# Patient Record
Sex: Female | Born: 1980 | Race: Black or African American | Hispanic: No | Marital: Married | Smoking: Never smoker
Health system: Southern US, Community
[De-identification: ages and names within clinical notes are randomized; demographics above are authoritative.]

## PROBLEM LIST (undated history)

## (undated) DIAGNOSIS — T7840XA Allergy, unspecified, initial encounter: Secondary | ICD-10-CM

## (undated) DIAGNOSIS — T783XXA Angioneurotic edema, initial encounter: Secondary | ICD-10-CM

## (undated) DIAGNOSIS — L509 Urticaria, unspecified: Secondary | ICD-10-CM

## (undated) HISTORY — DX: Allergy, unspecified, initial encounter: T78.40XA

## (undated) HISTORY — PX: APPENDECTOMY: SHX54

## (undated) HISTORY — DX: Angioneurotic edema, initial encounter: T78.3XXA

## (undated) HISTORY — DX: Urticaria, unspecified: L50.9

---
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
## 2002-01-19 ENCOUNTER — Encounter: Admission: RE | Admit: 2002-01-19 | Discharge: 2002-01-19 | Payer: Self-pay

## 2007-03-16 ENCOUNTER — Ambulatory Visit (HOSPITAL_COMMUNITY): Admission: RE | Admit: 2007-03-16 | Discharge: 2007-03-16 | Payer: Self-pay | Admitting: Obstetrics and Gynecology

## 2007-07-21 ENCOUNTER — Emergency Department (HOSPITAL_COMMUNITY): Admission: EM | Admit: 2007-07-21 | Discharge: 2007-07-21 | Payer: Self-pay | Admitting: Emergency Medicine

## 2011-06-04 LAB — CBC
Hemoglobin: 14.6
MCHC: 34
RDW: 12.8

## 2011-06-04 LAB — DIFFERENTIAL
Basophils Relative: 1
Eosinophils Absolute: 0.2
Eosinophils Relative: 3
Lymphs Abs: 1.4
Monocytes Absolute: 0.6
Monocytes Relative: 11

## 2011-06-04 LAB — COMPREHENSIVE METABOLIC PANEL
ALT: 14
AST: 36
Albumin: 4
Alkaline Phosphatase: 39
Calcium: 9.3
GFR calc Af Amer: >60
Potassium: 4.7
Sodium: 134 — ABNORMAL LOW
Total Protein: 7.2

## 2011-06-04 LAB — POCT URINALYSIS DIP (DEVICE)
Bilirubin Urine: NEGATIVE
Glucose, UA: NEGATIVE
Nitrite: NEGATIVE
Urobilinogen, UA: 0.2
pH: 6

## 2011-06-04 LAB — POCT PREGNANCY, URINE
Operator id: 116391
Preg Test, Ur: NEGATIVE

## 2011-09-02 ENCOUNTER — Other Ambulatory Visit (HOSPITAL_COMMUNITY)
Admission: RE | Admit: 2011-09-02 | Discharge: 2011-09-02 | Disposition: A | Discharge status: Home / Self Care | Payer: BC Managed Care – PPO | Source: Ambulatory Visit | Attending: Family Medicine | Admitting: Family Medicine

## 2011-09-02 DIAGNOSIS — Z0000 Encounter for general adult medical examination without abnormal findings: Secondary | ICD-10-CM | POA: Insufficient documentation

## 2011-09-02 DIAGNOSIS — Z113 Encounter for screening for infections with a predominantly sexual mode of transmission: Secondary | ICD-10-CM | POA: Insufficient documentation

## 2011-09-02 DIAGNOSIS — R87810 Cervical high risk human papillomavirus (HPV) DNA test positive: Secondary | ICD-10-CM | POA: Insufficient documentation

## 2013-09-17 ENCOUNTER — Other Ambulatory Visit: Payer: Self-pay | Admitting: Family Medicine

## 2013-09-17 ENCOUNTER — Other Ambulatory Visit (HOSPITAL_COMMUNITY)
Admission: RE | Admit: 2013-09-17 | Discharge: 2013-09-17 | Disposition: A | Discharge status: Home / Self Care | Payer: BC Managed Care – PPO | Source: Ambulatory Visit | Attending: Family Medicine | Admitting: Family Medicine

## 2013-09-17 DIAGNOSIS — Z01419 Encounter for gynecological examination (general) (routine) without abnormal findings: Secondary | ICD-10-CM | POA: Insufficient documentation

## 2013-09-17 DIAGNOSIS — Z113 Encounter for screening for infections with a predominantly sexual mode of transmission: Secondary | ICD-10-CM | POA: Insufficient documentation

## 2014-11-16 ENCOUNTER — Other Ambulatory Visit (HOSPITAL_COMMUNITY)
Admission: RE | Admit: 2014-11-16 | Discharge: 2014-11-16 | Disposition: A | Discharge status: Home / Self Care | Payer: BC Managed Care – PPO | Source: Ambulatory Visit | Attending: Family Medicine | Admitting: Family Medicine

## 2014-11-16 ENCOUNTER — Other Ambulatory Visit: Payer: Self-pay | Admitting: Family Medicine

## 2014-11-16 DIAGNOSIS — Z113 Encounter for screening for infections with a predominantly sexual mode of transmission: Secondary | ICD-10-CM | POA: Diagnosis present

## 2014-11-16 DIAGNOSIS — Z1151 Encounter for screening for human papillomavirus (HPV): Secondary | ICD-10-CM | POA: Insufficient documentation

## 2014-11-16 DIAGNOSIS — R87810 Cervical high risk human papillomavirus (HPV) DNA test positive: Secondary | ICD-10-CM | POA: Diagnosis present

## 2014-11-16 DIAGNOSIS — Z01411 Encounter for gynecological examination (general) (routine) with abnormal findings: Secondary | ICD-10-CM | POA: Diagnosis present

## 2014-11-17 LAB — CYTOLOGY - PAP

## 2014-11-24 ENCOUNTER — Telehealth: Payer: Self-pay | Admitting: Hematology

## 2014-11-24 NOTE — Telephone Encounter (Signed)
Called and scheduled patient to see Dr. Mosetta PuttFeng. TG  Feng 01/02/15 10:30 am  Dx: WBC continuing to drop Referring: Dr. Wynelle LinkSun

## 2015-01-02 ENCOUNTER — Encounter: Payer: Self-pay | Admitting: Hematology

## 2015-01-02 ENCOUNTER — Ambulatory Visit (HOSPITAL_BASED_OUTPATIENT_CLINIC_OR_DEPARTMENT_OTHER): Payer: BC Managed Care – PPO | Admitting: Hematology

## 2015-01-02 ENCOUNTER — Telehealth: Payer: Self-pay | Admitting: Hematology

## 2015-01-02 ENCOUNTER — Encounter (INDEPENDENT_AMBULATORY_CARE_PROVIDER_SITE_OTHER): Payer: Self-pay

## 2015-01-02 ENCOUNTER — Ambulatory Visit: Payer: BC Managed Care – PPO

## 2015-01-02 ENCOUNTER — Ambulatory Visit (HOSPITAL_BASED_OUTPATIENT_CLINIC_OR_DEPARTMENT_OTHER): Payer: BC Managed Care – PPO

## 2015-01-02 VITALS — BP 127/62 | HR 73 | Temp 98.4°F | Resp 20 | Ht 66.0 in | Wt 188.1 lb

## 2015-01-02 DIAGNOSIS — D709 Neutropenia, unspecified: Secondary | ICD-10-CM | POA: Diagnosis not present

## 2015-01-02 LAB — COMPREHENSIVE METABOLIC PANEL (CC13)
ALK PHOS: 46 U/L (ref 40–150)
ALT: 14 U/L (ref 0–55)
AST: 14 U/L (ref 5–34)
Albumin: 4 g/dL (ref 3.5–5.0)
Anion Gap: 11 mEq/L (ref 3–11)
BILIRUBIN TOTAL: 0.32 mg/dL (ref 0.20–1.20)
BUN: 11.9 mg/dL (ref 7.0–26.0)
CO2: 23 meq/L (ref 22–29)
Calcium: 8.9 mg/dL (ref 8.4–10.4)
Chloride: 106 mEq/L (ref 98–109)
Creatinine: 0.8 mg/dL (ref 0.6–1.1)
GLUCOSE: 83 mg/dL (ref 70–140)
Potassium: 4.2 mEq/L (ref 3.5–5.1)
Sodium: 140 mEq/L (ref 136–145)
TOTAL PROTEIN: 7.4 g/dL (ref 6.4–8.3)

## 2015-01-02 LAB — CBC & DIFF AND RETIC
BASO%: 1.1 % (ref 0.0–2.0)
Basophils Absolute: 0 10*3/uL (ref 0.0–0.1)
EOS ABS: 0.2 10*3/uL (ref 0.0–0.5)
EOS%: 4 % (ref 0.0–7.0)
HCT: 42.5 % (ref 34.8–46.6)
HGB: 14.5 g/dL (ref 11.6–15.9)
Immature Retic Fract: 1.4 % — ABNORMAL LOW (ref 1.60–10.00)
LYMPH%: 42.9 % (ref 14.0–49.7)
MCH: 30.5 pg (ref 25.1–34.0)
MCHC: 34.1 g/dL (ref 31.5–36.0)
MCV: 89.3 fL (ref 79.5–101.0)
MONO#: 0.4 10*3/uL (ref 0.1–0.9)
MONO%: 11.8 % (ref 0.0–14.0)
NEUT#: 1.5 10*3/uL (ref 1.5–6.5)
NEUT%: 40.2 % (ref 38.4–76.8)
PLATELETS: 209 10*3/uL (ref 145–400)
RBC: 4.76 10*6/uL (ref 3.70–5.45)
RDW: 13.4 % (ref 11.2–14.5)
RETIC %: 2.04 % (ref 0.70–2.10)
Retic Ct Abs: 97.1 10*3/uL — ABNORMAL HIGH (ref 33.70–90.70)
WBC: 3.7 10*3/uL — ABNORMAL LOW (ref 3.9–10.3)
lymph#: 1.6 10*3/uL (ref 0.9–3.3)

## 2015-01-02 LAB — RHEUMATOID FACTOR: Rhuematoid fact SerPl-aCnc: <10 IU/mL (ref <=14)

## 2015-01-02 LAB — LACTATE DEHYDROGENASE (CC13): LDH: 189 U/L (ref 125–245)

## 2015-01-02 NOTE — Telephone Encounter (Signed)
per pof to sch pt appt-gave pt copy of sch-pt back to lab- °

## 2015-01-02 NOTE — Progress Notes (Signed)
Checked in new pt with no financial concerns at this time.  Pt has my card for any billing questions or concerns. °

## 2015-01-02 NOTE — Progress Notes (Signed)
[place]  Telephone:([phone]) [phone] Fax:([phone]) [place] Note   Patient Care Team: Carol Ada, MD as PCP - General (Family Medicine) 01/04/2015  CHIEF COMPLAINTS/PURPOSE OF CONSULTATION:  Neutropenia   Referring physician: Reginia Naas, MD   HISTORY OF PRESENTING ILLNESS:     Holly Reese 34 y.o. female is here because of neutropenia.  According to the medical records from her primary care physician, she has had mild neutropenia since earlier 2014. Her CBC from generally 20th 2014, [place] 23rd 2015, 11/16/2014 showed WBC 3.6K, 2.5K, 2.8K, with ANC 1.2K, 1.1K, and 0.9K. The rest of WBC were normal. Hemoglobin and platelet count are all normal also.  She had several UTI in the past, on average once a year. She was never admitted to hospital or had emergency room visits for infections. She feels well overall, denies any pain, discomfort or other symptoms.    She states she had anemia when she was a child, no treatment needed. It resolved after she grew up.    MEDICAL HISTORY:  Past Medical History  Diagnosis Date  . Allergy     SURGICAL HISTORY: Past Surgical History  Procedure Laterality Date  . Appendectomy      SOCIAL HISTORY: History   Social History  . Marital Status: Single    Spouse Name: N/A  . Number of Children: N/A  . Years of Education: N/A   Occupational History  . Not on file.   Social History Main Topics  . Smoking status: Never Smoker   . Smokeless tobacco: Not on file  . Alcohol Use: 1.8 oz/week    2 Glasses of wine, 1 Cans of beer per week     Comment: twice a week   . Drug Use: No  . Sexual Activity: Not on file   Other Topics Concern  . Not on file   Social History Narrative   She is a high Education officer, museum, no children.    FAMILY HISTORY: Family History  Problem Relation Age of Onset  . Cancer Maternal Aunt     bleeding disorder   . Cancer Maternal Grandfather     throat cancer       ALLERGIES:  has No Known Allergies.  MEDICATIONS:  Current Outpatient Prescriptions  Medication Sig Dispense Refill  . ALTAVERA 0.15-30 MG-MCG tablet Take 1 tablet by mouth daily.  1  . fluticasone (FLONASE) 50 MCG/ACT nasal spray daily as needed.  3  . ibuprofen (ADVIL,MOTRIN) 200 MG tablet Take 200 mg by mouth as needed.    . loratadine (CLARITIN) 10 MG tablet Take 10 mg by mouth daily.     No current facility-administered medications for this visit.    REVIEW OF SYSTEMS:   Constitutional: Denies fevers, chills or abnormal night sweats Eyes: Denies blurriness of vision, double vision or watery eyes Ears, nose, mouth, throat, and face: Denies mucositis or sore throat Respiratory: Denies cough, dyspnea or wheezes Cardiovascular: Denies palpitation, chest discomfort or lower extremity swelling Gastrointestinal:  Denies nausea, heartburn or change in bowel habits Skin: Denies abnormal skin rashes Lymphatics: Denies new lymphadenopathy or easy bruising Neurological:Denies numbness, tingling or new weaknesses Behavioral/Psych: Mood is stable, no new changes  All other systems were reviewed with the patient and are negative.  PHYSICAL EXAMINATION: ECOG PERFORMANCE STATUS: 0 - Asymptomatic  Filed Vitals:   01/02/15 1105  BP: 127/62  Pulse: 73  Temp: 98.4 F (36.9 C)  Resp: 20   Filed Weights   01/02/15  1101 01/02/15 1105  Weight: 188 lb 1.6 oz (85.322 kg) 188 lb 1.6 oz (85.322 kg)    GENERAL:alert, no distress and comfortable SKIN: skin color, texture, turgor are normal, no rashes or significant lesions EYES: normal, conjunctiva are pink and non-injected, sclera clear OROPHARYNX:no exudate, no erythema and lips, buccal mucosa, and tongue normal  NECK: supple, thyroid normal size, non-tender, without nodularity LYMPH:  no palpable lymphadenopathy in the cervical, axillary or inguinal LUNGS: clear to auscultation and percussion with normal breathing effort HEART:  regular rate & rhythm and no murmurs and no lower extremity edema ABDOMEN:abdomen soft, non-tender and normal bowel sounds Musculoskeletal:no cyanosis of digits and no clubbing  PSYCH: alert & oriented x 3 with fluent speech NEURO: no focal motor/sensory deficits  LABORATORY DATA:  I have reviewed the data as listed Lab Results  Component Value Date   WBC 3.7* 01/02/2015   HGB 14.5 01/02/2015   HCT 42.5 01/02/2015   MCV 89.3 01/02/2015   PLT 209 01/02/2015    Recent Labs  01/02/15 1240  NA 140  K 4.2  CO2 23  GLUCOSE 83  BUN 11.9  CREATININE 0.8  CALCIUM 8.9  PROT 7.4  ALBUMIN 4.0  AST 14  ALT 14  ALKPHOS 46  BILITOT 0.32    RADIOGRAPHIC STUDIES: I have personally reviewed the radiological images as listed and agreed with the findings in the report. No results found.  ASSESSMENT & PLAN:  34 year old African-American female, no significant past medical history, presented with chronic mild leukopenia, with predominant neutropenia.  1. Mild neutropenia -Her repeated CBC today showed WBC 3.7, ANC 1.5. The rest of WBC were normal. -She has not had severe infection in the past -Giving the mild neutropenia, and chronic course, this is possibly related to her ethnicity (African-American), benign cyclic neutropenia, or autoimmune mediated neutropenia. -I'll check ANA, ANCA,  rheumatoid arthritis, ESR, hepatitis B and C. She was tested negative for HIV. -I'll also check folic acid and T01 level -I'll obtain ultrasound of abdomen to eval at her liver and spleen -We discussed the risk of infection with neutropenia. She knows to call me or go to the emergency room if she develops fever or not feeling well. -I'll see her back in 3 months with repeated CBC and differential.  All questions were answered. The patient knows to call the clinic with any problems, questions or concerns. I spent 30 minutes counseling the patient face to face. The total time spent in the appointment was  40 minutes and more than 50% was on counseling.     Truitt Merle, MD 01/02/2015 7:08 PM

## 2015-01-03 ENCOUNTER — Encounter: Payer: Self-pay | Admitting: Hematology

## 2015-01-03 DIAGNOSIS — D709 Neutropenia, unspecified: Secondary | ICD-10-CM | POA: Insufficient documentation

## 2015-01-03 LAB — VITAMIN B12: Vitamin B-12: 339 pg/mL (ref 211–911)

## 2015-01-03 LAB — ANCA SCREEN W REFLEX TITER
Atypical p-ANCA Screen: NEGATIVE
C-ANCA SCREEN: POSITIVE — AB
p-ANCA Screen: NEGATIVE

## 2015-01-03 LAB — ANCA TITERS: C-ANCA: 1:40 {titer} — ABNORMAL HIGH

## 2015-01-03 LAB — FOLATE: FOLATE: 13.6 ng/mL

## 2015-01-03 LAB — HEPATITIS C ANTIBODY: HCV Ab: NEGATIVE

## 2015-01-03 LAB — HEPATITIS B SURFACE ANTIGEN: HEP B S AG: NEGATIVE

## 2015-01-03 LAB — HEPATITIS B SURFACE ANTIBODY,QUALITATIVE: HEP B S AB: POSITIVE — AB

## 2015-01-03 LAB — ANA: ANA: NEGATIVE

## 2015-01-04 ENCOUNTER — Encounter: Payer: Self-pay | Admitting: Hematology

## 2015-01-16 ENCOUNTER — Ambulatory Visit (HOSPITAL_COMMUNITY)
Admission: RE | Admit: 2015-01-16 | Discharge: 2015-01-16 | Disposition: A | Discharge status: Home / Self Care | Payer: BC Managed Care – PPO | Source: Ambulatory Visit | Attending: Hematology | Admitting: Hematology

## 2015-01-16 DIAGNOSIS — D709 Neutropenia, unspecified: Secondary | ICD-10-CM | POA: Insufficient documentation

## 2015-02-22 ENCOUNTER — Other Ambulatory Visit: Payer: Self-pay | Admitting: Obstetrics and Gynecology

## 2015-03-23 ENCOUNTER — Telehealth: Payer: Self-pay | Admitting: Hematology

## 2015-03-23 NOTE — Telephone Encounter (Signed)
pt cld to CX appt-stated will call to r/s at a later date

## 2015-04-04 ENCOUNTER — Other Ambulatory Visit: Payer: BC Managed Care – PPO

## 2015-04-04 ENCOUNTER — Ambulatory Visit: Payer: BC Managed Care – PPO | Admitting: Hematology

## 2015-11-17 ENCOUNTER — Other Ambulatory Visit: Payer: Self-pay | Admitting: Family Medicine

## 2015-11-17 ENCOUNTER — Other Ambulatory Visit (HOSPITAL_COMMUNITY)
Admission: RE | Admit: 2015-11-17 | Discharge: 2015-11-17 | Disposition: A | Discharge status: Home / Self Care | Payer: BC Managed Care – PPO | Source: Ambulatory Visit | Attending: Family Medicine | Admitting: Family Medicine

## 2015-11-17 DIAGNOSIS — Z1151 Encounter for screening for human papillomavirus (HPV): Secondary | ICD-10-CM | POA: Insufficient documentation

## 2015-11-17 DIAGNOSIS — Z01411 Encounter for gynecological examination (general) (routine) with abnormal findings: Secondary | ICD-10-CM | POA: Diagnosis present

## 2015-11-17 DIAGNOSIS — Z113 Encounter for screening for infections with a predominantly sexual mode of transmission: Secondary | ICD-10-CM | POA: Diagnosis present

## 2015-11-21 LAB — CYTOLOGY - PAP

## 2016-01-19 IMAGING — US US ABDOMEN COMPLETE
1 series · 13 of 25 positions shown · non-contrast
Comparison: CT scan of the abdomen and pelvis of July 21, 2007

CLINICAL DATA: Unexplained neutropenia

EXAM:
ULTRASOUND ABDOMEN COMPLETE

[Series 1: us abdomen complete · 0.21mm/px · 13 of 97 slices shown]
[im 1/97]
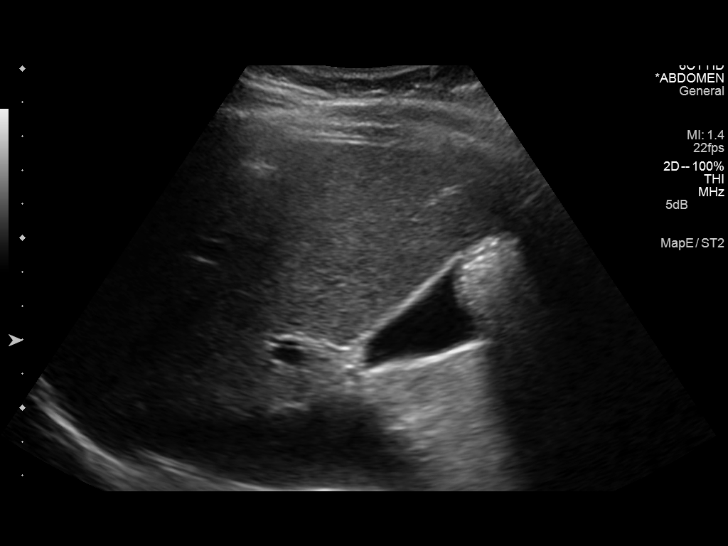
[im 9/97]
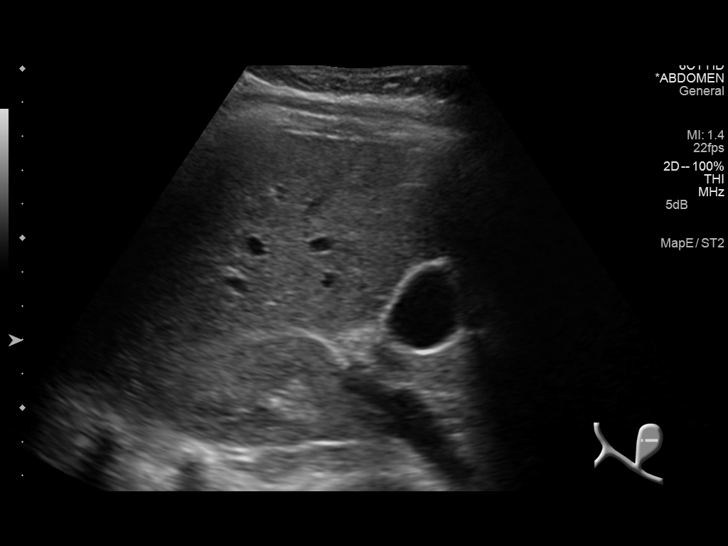
[im 17/97]
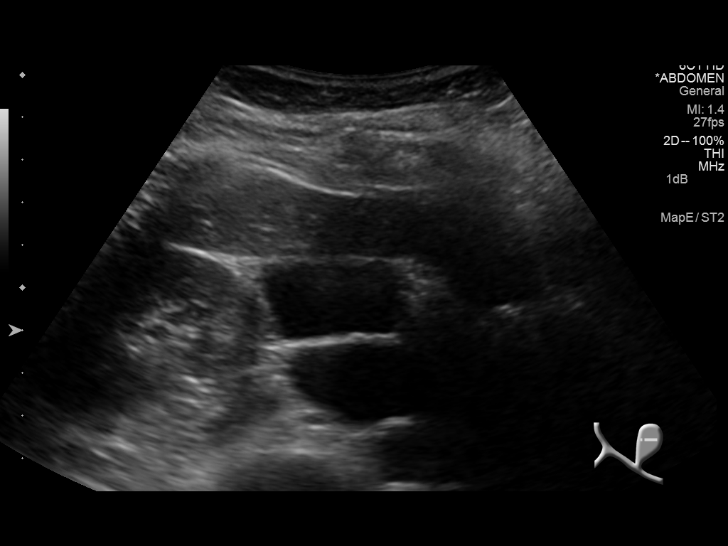
[im 25/97]
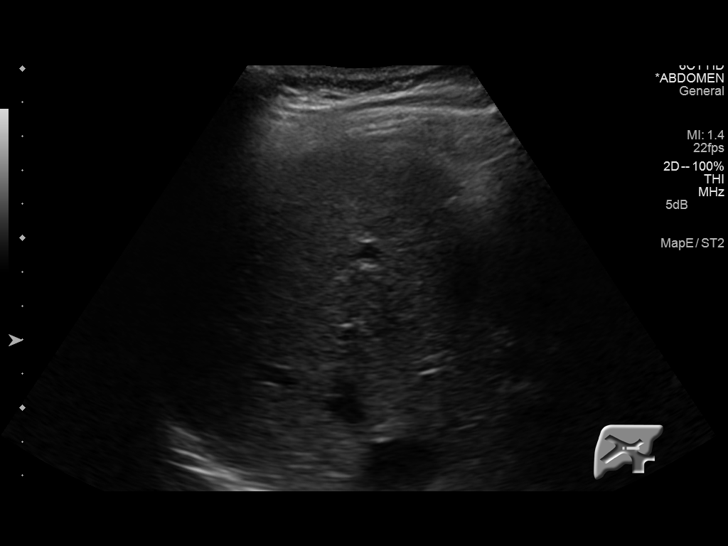
[im 33/97]
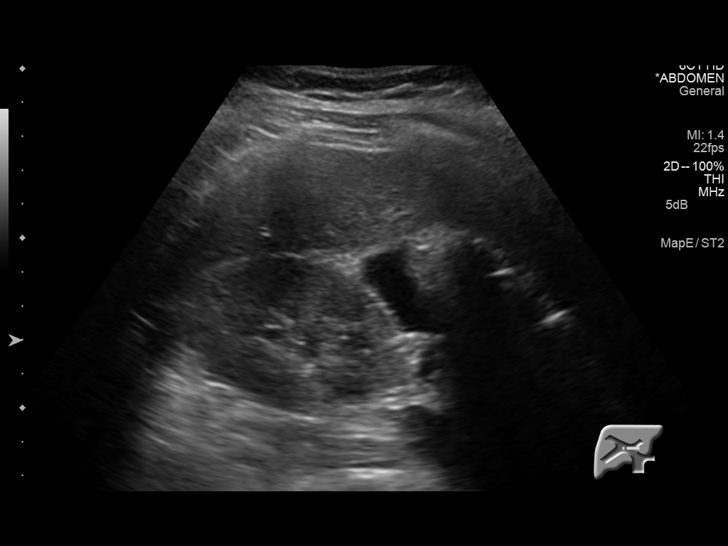
[im 41/97]
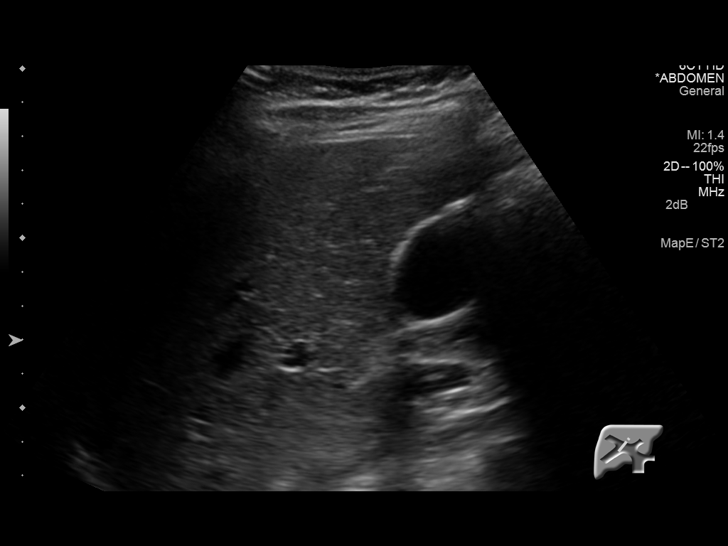
[im 49/97]
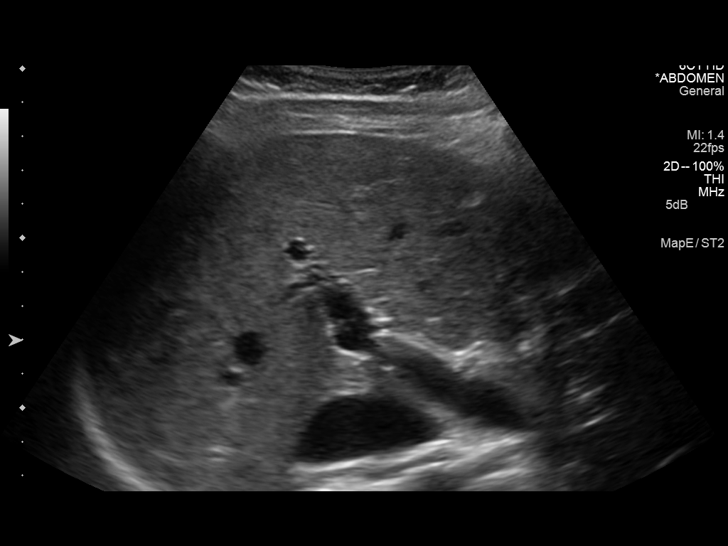
[im 57/97]
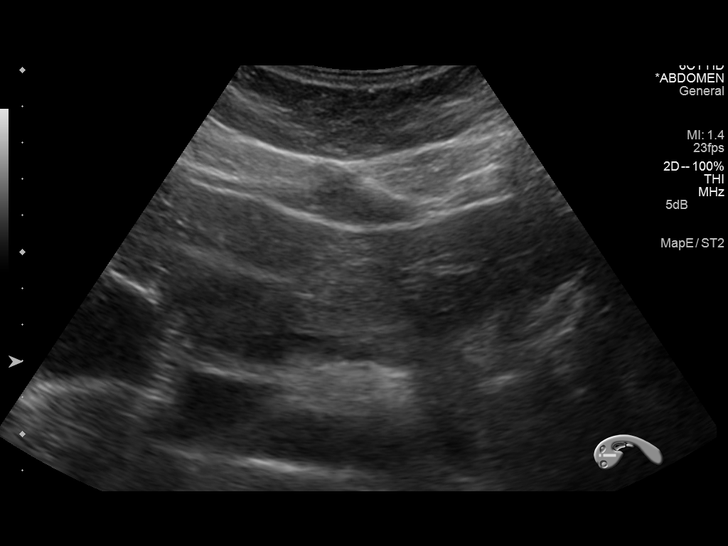
[im 65/97]
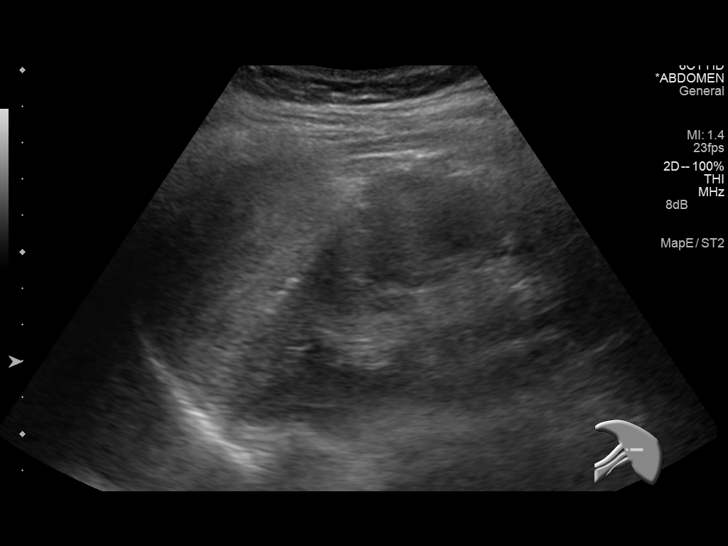
[im 73/97]
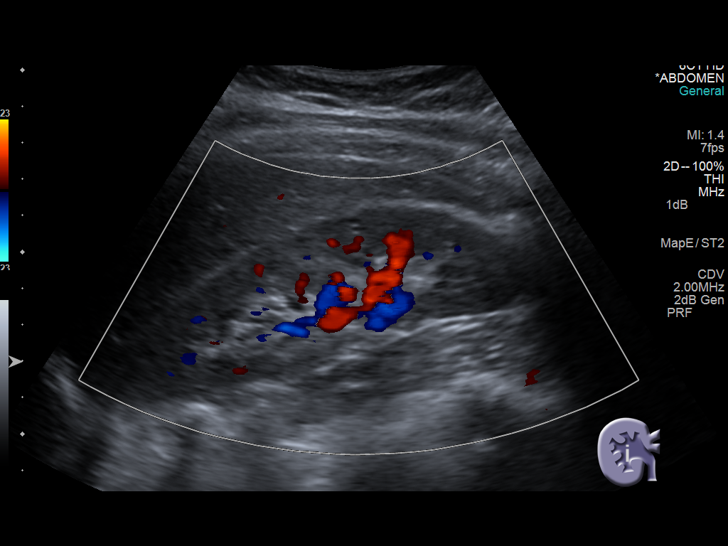
[im 81/97]
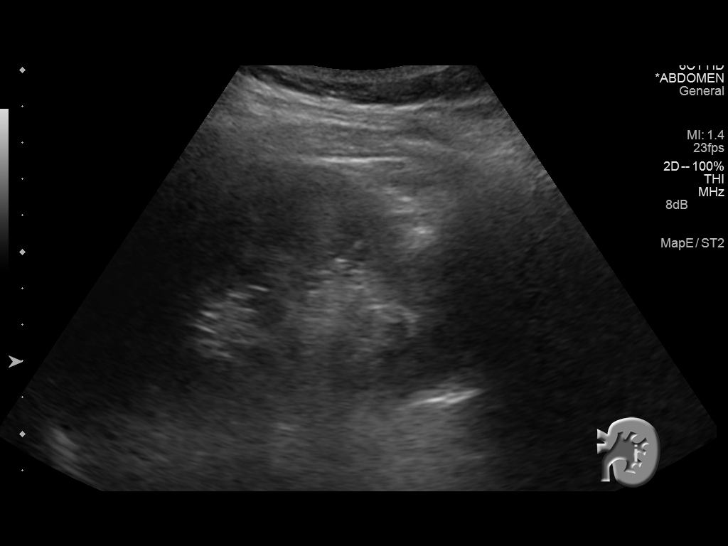
[im 89/97]
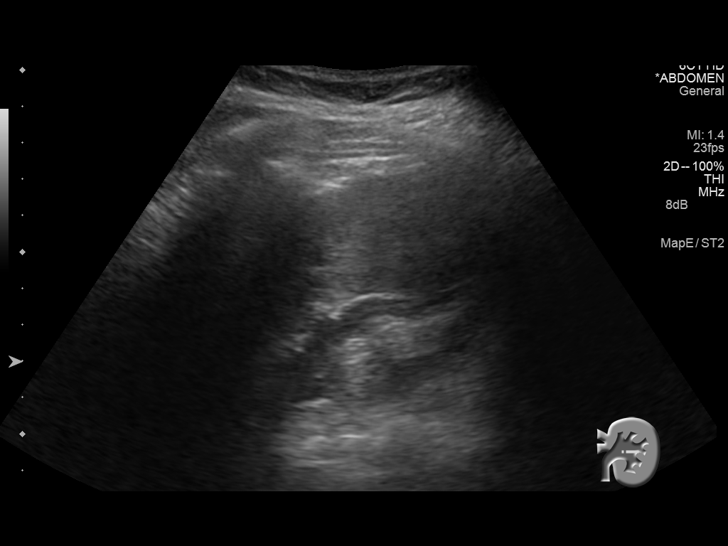
[im 97/97]
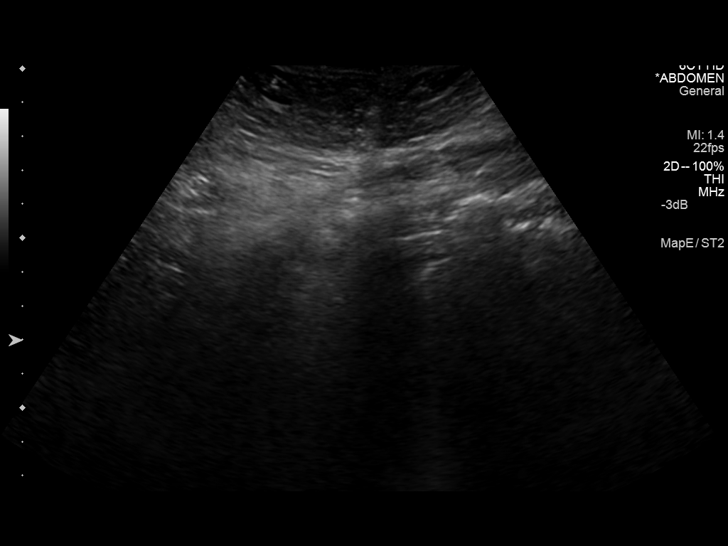

[13 of 25 positions shown; findings below may reference images not displayed]

FINDINGS: Gallbladder: The gallbladder is adequately distended. There is
echogenic material near the gallbladder fundus but there is adjacent
shadowing from the duodenum. There is no gallbladder wall
thickening, pericholecystic fluid, or positive sonographic Murphy's
sign.

Common bile duct: Diameter: 2 mm

Liver: The liver exhibits no focal mass nor ductal dilation.

IVC: No abnormality visualized.

Pancreas: Visualized portion unremarkable.

Spleen: Size and appearance within normal limits.

Right Kidney: Length: 11.6 cm. Echogenicity within normal limits. No
mass or hydronephrosis visualized.

Left Kidney: Length: 10.5 cm. Echogenicity within normal limits. No
mass or hydronephrosis visualized.

Abdominal aorta: No aneurysm is demonstrated. The distal aorta and
the bifurcation were obscured by bowel gas.

Other findings: There is no ascites.
IMPRESSION: 1. Evaluation of the gallbladder fundus is limited due to shadowing
from the adjacent duodenum. No definite acute abnormality of the
gallbladder is demonstrated.
2. The liver and spleen and other visualized abdominal viscera are
normal.

## 2016-11-18 ENCOUNTER — Other Ambulatory Visit: Payer: Self-pay | Admitting: Family Medicine

## 2016-11-18 ENCOUNTER — Other Ambulatory Visit (HOSPITAL_COMMUNITY)
Admission: RE | Admit: 2016-11-18 | Discharge: 2016-11-18 | Disposition: A | Discharge status: Home / Self Care | Payer: BC Managed Care – PPO | Source: Ambulatory Visit | Attending: Family Medicine | Admitting: Family Medicine

## 2016-11-18 DIAGNOSIS — Z1151 Encounter for screening for human papillomavirus (HPV): Secondary | ICD-10-CM | POA: Insufficient documentation

## 2016-11-18 DIAGNOSIS — Z0000 Encounter for general adult medical examination without abnormal findings: Secondary | ICD-10-CM | POA: Diagnosis present

## 2016-11-20 LAB — CYTOLOGY - PAP
Diagnosis: NEGATIVE
HPV: NOT DETECTED

## 2017-11-19 ENCOUNTER — Other Ambulatory Visit: Payer: Self-pay | Admitting: Family Medicine

## 2017-11-19 ENCOUNTER — Other Ambulatory Visit (HOSPITAL_COMMUNITY)
Admission: RE | Admit: 2017-11-19 | Discharge: 2017-11-19 | Disposition: A | Discharge status: Home / Self Care | Payer: BC Managed Care – PPO | Source: Ambulatory Visit | Attending: Family Medicine | Admitting: Family Medicine

## 2017-11-19 DIAGNOSIS — Z0000 Encounter for general adult medical examination without abnormal findings: Secondary | ICD-10-CM | POA: Diagnosis present

## 2017-11-20 LAB — CYTOLOGY - PAP
Diagnosis: NEGATIVE
HPV: DETECTED — AB

## 2018-08-07 ENCOUNTER — Emergency Department (HOSPITAL_COMMUNITY)
Admission: EM | Admit: 2018-08-07 | Discharge: 2018-08-07 | Disposition: A | Discharge status: Home / Self Care | Payer: BC Managed Care – PPO | Attending: Emergency Medicine | Admitting: Emergency Medicine

## 2018-08-07 ENCOUNTER — Other Ambulatory Visit: Payer: Self-pay

## 2018-08-07 DIAGNOSIS — Z79899 Other long term (current) drug therapy: Secondary | ICD-10-CM | POA: Insufficient documentation

## 2018-08-07 DIAGNOSIS — T7840XA Allergy, unspecified, initial encounter: Secondary | ICD-10-CM | POA: Diagnosis not present

## 2018-08-07 DIAGNOSIS — R220 Localized swelling, mass and lump, head: Secondary | ICD-10-CM | POA: Diagnosis present

## 2018-08-07 MED ORDER — DIPHENHYDRAMINE HCL 25 MG PO TABS: 25.0000 mg | ORAL_TABLET | Freq: Four times a day (QID) | ORAL | 0 refills | Status: AC | PRN

## 2018-08-07 MED ORDER — PREDNISONE 20 MG PO TABS
60.0000 mg | ORAL_TABLET | Freq: Once | ORAL | Status: AC
  Administered 2018-08-07: 60 mg via ORAL
  Filled 2018-08-07: qty 3

## 2018-08-07 MED ORDER — DIPHENHYDRAMINE HCL 25 MG PO CAPS
50.0000 mg | ORAL_CAPSULE | Freq: Once | ORAL | Status: AC
  Administered 2018-08-07: 50 mg via ORAL
  Filled 2018-08-07: qty 2

## 2018-08-07 MED ORDER — EPINEPHRINE 0.3 MG/0.3ML IJ SOAJ: 0.3000 mg | Freq: Once | INTRAMUSCULAR | 0 refills | Status: AC

## 2018-08-07 MED ORDER — FAMOTIDINE 20 MG PO TABS: 20.0000 mg | ORAL_TABLET | Freq: Two times a day (BID) | ORAL | 0 refills | Status: DC

## 2018-08-07 MED ORDER — PREDNISONE 10 MG PO TABS: ORAL_TABLET | ORAL | 0 refills | Status: DC

## 2018-08-07 MED ORDER — FAMOTIDINE 20 MG PO TABS
20.0000 mg | ORAL_TABLET | Freq: Once | ORAL | Status: AC
  Administered 2018-08-07: 20 mg via ORAL
  Filled 2018-08-07: qty 1

## 2018-08-07 NOTE — ED Notes (Signed)
Bed: WTR8 Expected date:  Expected time:  Means of arrival:  Comments: 

## 2018-08-07 NOTE — ED Triage Notes (Signed)
PT to ed by POV with c/o of Allergic Reaction. Pt states she was out tasting different beers and then started to have allergic reaction on her face and lips. Pt has swelling to her right side of her face and lips. Pt denies SOB, dizziness, or diaphoresis, airway is patent.  Pt states taking 10 mg of Zyrtec. Pt is A&O x4 and ambulatory.

## 2018-08-07 NOTE — Discharge Instructions (Signed)
Return to the ED with any worsening symptoms or new concerns. If the EpiPen is felt necessary for onset of new and worse symptoms, return to the ED immediately after use.

## 2018-08-07 NOTE — ED Provider Notes (Signed)
[facility]-EMERGENCY DEPT Provider Note   CSN: [id number] Arrival date & time: 08/07/18  0025     History   Chief Complaint Chief Complaint  Patient presents with  . Allergic Reaction    HPI Holly Reese is a 37 y.o. female.  Patient to ED with concern for allergic reaction. She went to a restaurant for dinner around 9:30 pm and after starting the meal noticed a progressive swelling affecting right side of upper and lower lips, and cheek. Mild swelling to eye lids. No tongue swelling, difficulty swallowing, SOB, wheezing or rash. She denies any history of allergic reaction of this type. She took Zyrtec about 1.5 hours after reaction began without noticeable affect.   The history is provided by the patient. No language interpreter was used.  Allergic Reaction  Presenting symptoms: no difficulty swallowing, no rash and no wheezing     Past Medical History:  Diagnosis Date  . Allergy     Patient Active Problem List   Diagnosis Date Noted  . Neutropenia (HCC) 01/03/2015    Past Surgical History:  Procedure Laterality Date  . APPENDECTOMY       OB History   No obstetric history on file.      Home Medications    Prior to Admission medications   Medication Sig Start Date End Date Taking? Authorizing Provider  ALTAVERA 0.15-30 MG-MCG tablet Take 1 tablet by mouth daily. 12/09/14   [provider]  fluticasone (FLONASE) 50 MCG/ACT nasal spray daily as needed. 12/07/14   [provider]  ibuprofen (ADVIL,MOTRIN) 200 MG tablet Take 200 mg by mouth as needed.    [provider]  loratadine (CLARITIN) 10 MG tablet Take 10 mg by mouth daily.    [provider]    Family History Family History  Problem Relation Age of Onset  . Cancer Maternal Aunt        bleeding disorder   . Cancer Maternal Grandfather        throat cancer     Social History Social History   Tobacco Use  . Smoking status: Never Smoker    Substance Use Topics  . Alcohol use: Yes    Alcohol/week: 3.0 standard drinks    Types: 2 Glasses of wine, 1 Cans of beer per week    Comment: twice a week   . Drug use: No     Allergies   Patient has no known allergies.   Review of Systems Review of Systems  Constitutional: Negative for diaphoresis.  HENT: Positive for facial swelling. Negative for trouble swallowing.   Respiratory: Negative for shortness of breath, wheezing and stridor.   Cardiovascular: Negative for chest pain.  Gastrointestinal: Negative for nausea and vomiting.  Musculoskeletal: Negative for neck pain.  Skin: Negative for rash.  Neurological: Negative for dizziness, light-headedness and headaches.     Physical Exam Updated Vital Signs BP 127/81 (BP Location: Right Arm)   Pulse 85   Temp 98.6 F (37 C) (Oral)   Resp 18   Ht 5\' 6"  (1.676 m)   Wt 94.3 kg   SpO2 100%   BMI 33.57 kg/m   Physical Exam Constitutional:      Appearance: She is well-developed.  HENT:     Head: Normocephalic.     Comments: Right sided facial edema that is moderate. No redness, rash, hives. No intraoral swelling. Pharynx is benign. Managing own secretions without any observed difficulty swallowing.    Nose: Nose normal.  Mouth/Throat:     Mouth: Mucous membranes are moist.  Neck:     Musculoskeletal: Normal range of motion and neck supple.  Cardiovascular:     Rate and Rhythm: Normal rate and regular rhythm.  Pulmonary:     Effort: Pulmonary effort is normal.     Breath sounds: Normal breath sounds. No stridor. No wheezing.  Abdominal:     General: Bowel sounds are normal.     Palpations: Abdomen is soft.     Tenderness: There is no abdominal tenderness. There is no guarding or rebound.  Musculoskeletal: Normal range of motion.  Skin:    General: Skin is warm and dry.     Findings: No erythema or rash.  Neurological:     Mental Status: She is alert and oriented to person, place, and time.      ED  Treatments / Results  Labs (all labs ordered are listed, but only abnormal results are displayed) Labs Reviewed - No data to display  EKG None  Radiology No results found.  Procedures Procedures (including critical care time)  Medications Ordered in ED Medications  diphenhydrAMINE (BENADRYL) capsule 50 mg (50 mg Oral Given 08/07/18 0109)  famotidine (PEPCID) tablet 20 mg (20 mg Oral Given 08/07/18 0109)  predniSONE (DELTASONE) tablet 60 mg (60 mg Oral Given 08/07/18 0109)     Initial Impression / Assessment and Plan / ED Course  I have reviewed the triage vital signs and the nursing notes.  Pertinent labs & imaging results that were available during my care of the patient were reviewed by me and considered in my medical decision making (see chart for details).     Patient to ED with acute allergic reaction, unknown source. No history of reactions. Zyrtec without relief. She reports symptom progression has slowed. No SOB, dysphagia, tongue swelling, wheezing.   PO medications indicated. VSS, no tachycardia or hypoxia. PO prednisone, pepcid and benadryl provided. Will observe for a 2-hour period for improvement.  On re-evaluation, the patient has less swelling and feels improved. Symptoms have not resolved but have not progressed. No new symptoms. Feel she is appropriate for discharge home and patient/spouse are comfortable with discharge. Rx: prednisone on 6-day taper, Benadryl q 6 hours x 3 days then prn, Pepcid 20 mg bid x 3 days, then prn. Patient given an EpiPen and is made aware that, if needed for new significant symptoms that return to the ED is essential.   Final Clinical Impressions(s) / ED Diagnoses   Final diagnoses:  None   1. Acute allergic reaction  ED Discharge Orders    None       Elpidio AnisUpstill, Erma Raiche, PA-C 08/07/18 0328    Derwood KaplanNanavati, Ankit, MD 08/07/18 [phone]

## 2018-09-09 ENCOUNTER — Ambulatory Visit: Payer: BC Managed Care – PPO | Admitting: Allergy

## 2018-09-09 ENCOUNTER — Encounter: Payer: Self-pay | Admitting: Allergy

## 2018-09-09 VITALS — BP 110/56 | HR 82 | Temp 98.4°F | Resp 18 | Ht 65.8 in | Wt 210.4 lb

## 2018-09-09 DIAGNOSIS — T783XXD Angioneurotic edema, subsequent encounter: Secondary | ICD-10-CM

## 2018-09-09 DIAGNOSIS — T783XXA Angioneurotic edema, initial encounter: Secondary | ICD-10-CM

## 2018-09-09 HISTORY — DX: Angioneurotic edema, initial encounter: T78.3XXA

## 2018-09-09 NOTE — Patient Instructions (Addendum)
Today's testing showed: Negative to strawberry and apples but positive control was negative as well questioning the validity of the results.  Do not eat apples or drink the hard cider. For mild symptoms you can take over the counter antihistamines such as Benadryl and monitor symptoms closely. If symptoms worsen or if you have severe symptoms including breathing issues, throat closure, significant swelling, whole body hives, severe diarrhea and vomiting, lightheadedness then seek immediate medical care.  Get bloodwork - apple IgE, tryptase, C4, C1 esterase inhibitor & function Continue zyrtec 10mg  daily for now.  Follow up in 3 months

## 2018-09-09 NOTE — Assessment & Plan Note (Signed)
1 episode of lip angioedema after drinking various alcoholic beverages. Took zyrtec with no benefit and went to the ER where she was treated with prednisone, Pepcid and benadryl. Symptoms resolved the following day. Denies any pruritus, hives or respiratory compromise. No previous history of angioedema.   Today's testing showed: negative to strawberry and apples but positive control was negative as well questioning the validity of the results.  Do not eat apples or drink the hard cider.  For mild symptoms you can take over the counter antihistamines such as Benadryl and monitor symptoms closely. If symptoms worsen or if you have severe symptoms including breathing issues, throat closure, significant swelling, whole body hives, severe diarrhea and vomiting, lightheadedness then inject epinephrine and seek immediate medical care afterwards.  Get bloodwork - apple IgE, tryptase, C4, C1 esterase inhibitor & function  Continue zyrtec 10mg  daily for now.  Avoid drinking alcohol and taking NSAIDs at the same time.

## 2018-09-09 NOTE — Progress Notes (Signed)
New Patient Note  RE: Holly Reese MRN: [id number] DOB: 09/28/1980 Date of Office Visit: 09/09/2018  Referring provider: Merri BrunetteSmith, Candace, MD Primary care provider: Merri BrunetteSmith, Candace, MD  Chief Complaint: Allergic Reaction Okey Dupre(Rose' sample )  History of Present Illness: I had the pleasure of seeing Holly Reese for initial evaluation at the [facility] on 09/09/2018. She is a 38 y.o. female, who is referred here by Merri BrunetteSmith, Candace, MD for the evaluation of allergic reaction.  On 08/07/2018 patient was at a bar and was trying various beers and after drinking a hard cider rose she felt like her lips were getting tingly and swollen. No other symptoms. She put some Rubie MaidMary Kay chapstick on and the lips were getting bigger about 1 hour later. She took zyrtec and the symptoms did not improve even after 45 minutes so she went to the ER. She was treated with prednisone, Pepcid and benadryl. Symptoms resolved after a few hours and completely resolved within 1 day. She had used Rubie MaidMary Kay chapstick since then with no issues.  She had chicken wings, fries and long island slushie for dinner. Patient had chicken wings, fries and long island since then with no issues. She had Blue moon, truly's since then with no issues.   Patient has not had any apples since then.   Denies any changes in medications, personal care products, recent infections. Patient does take ibuprofen as needed for headaches and does not believe she took it this day.   No previous history of swelling. She does have history of hives when expose to dust mites.   Dietary History: patient has been eating other foods including milk, eggs, peanut, treenuts, sesame, shellfish, seafood, soy, wheat, meats, fruits and vegetables.  Bold rock cider rose ingredient list:  A wide variety of red apples and a naturally occurring pigment extract to achieve the color profile.  Assessment and Plan: Holly Reese is a 38 y.o. female  with: Angio-edema 1 episode of lip angioedema after drinking various alcoholic beverages. Took zyrtec with no benefit and went to the ER where she was treated with prednisone, Pepcid and benadryl. Symptoms resolved the following day. Denies any pruritus, hives or respiratory compromise. No previous history of angioedema.   Today's testing showed: negative to strawberry and apples but positive control was negative as well questioning the validity of the results.  Do not eat apples or drink the hard cider.  For mild symptoms you can take over the counter antihistamines such as Benadryl and monitor symptoms closely. If symptoms worsen or if you have severe symptoms including breathing issues, throat closure, significant swelling, whole body hives, severe diarrhea and vomiting, lightheadedness then inject epinephrine and seek immediate medical care afterwards.  Get bloodwork - apple IgE, tryptase, C4, C1 esterase inhibitor & function  Continue zyrtec 10mg  daily for now.  Avoid drinking alcohol and taking NSAIDs at the same time.   Return in about 3 months (around 12/09/2018).  Lab Orders     Apple IgE     Tryptase     C4 complement     C1 Esterase Inhibitor     C1 esterase inhibitor, functional  Other allergy screening: Asthma: no Rhino conjunctivitis: yes  Hives and rhinorrhea when exposed to dust mites.  Medication allergy: no Hymenoptera allergy: no Eczema:no History of recurrent infections suggestive of immunodeficency: no  Diagnostics: Skin Testing: select fods. Negative test to: foods including positive control which questions the validity of this test.  Results discussed  with patient/family. Food Adult Perc - 09/09/18 1400    Time Antigen Placed  1415    Allergen Manufacturer  Waynette Buttery    Location  Arm    Number of allergen test  4     Control-buffer 50% Glycerol  Negative    Control-Histamine 1 mg/ml  Negative    58. Apple  Negative    60. Strawberry  Negative       Past Medical History: Patient Active Problem List   Diagnosis Date Noted  . 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 Neutropenia (HCC) 01/03/2015   Past Medical History:  Diagnosis Date  . Allergy   . 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 Urticaria    Past Surgical History: Past Surgical History:  Procedure Laterality Date  . APPENDECTOMY     Medication List:  Current Outpatient Medications  Medication Sig Dispense Refill  . ALPRAZolam (XANAX) 0.25 MG tablet     . diphenhydrAMINE (BENADRYL) 25 MG tablet Take 1 tablet (25 mg total) by mouth every 6 (six) hours as needed. Take every 6 hours for 3 days, then as needed for recurrent symptoms 30 tablet 0  . EPINEPHrine 0.3 mg/0.3 mL IJ SOAJ injection     . fluticasone (FLONASE) 50 MCG/ACT nasal spray daily as needed.  3  . ibuprofen (ADVIL,MOTRIN) 200 MG tablet Take 200 mg by mouth as needed.    . Levonorgestrel-Ethinyl Estrad (LILLOW PO) Take by mouth.     No current facility-administered medications for this visit.    Allergies: No Known Allergies Social History: Social History   Socioeconomic History  . Marital status: Married    Spouse name: Not on file  . Number of children: Not on file  . Years of education: Not on file  . Highest education level: Not on file  Occupational History  . Not on file  Social Needs  . Financial resource strain: Not on file  . Food insecurity:    Worry: Not on file    Inability: Not on file  . Transportation needs:    Medical: Not on file    Non-medical: Not on file  Tobacco Use  . Smoking status: Never Smoker  Substance and Sexual Activity  . Alcohol use: Yes    Alcohol/week: 3.0 standard drinks    Types: 2 Glasses of wine, 1 Cans of beer per week    Comment: twice a week   . Drug use: No  . Sexual activity: Not on file  Lifestyle  . Physical activity:    Days per week: Not on file    Minutes per session: Not on file  . Stress: Not on file  Relationships  . Social connections:    Talks on phone:  Not on file    Gets together: Not on file    Attends religious service: Not on file    Active member of club or organization: Not on file    Attends meetings of clubs or organizations: Not on file    Relationship status: Not on file  Other Topics Concern  . Not on file  Social History Narrative  . Not on file   Lives in a 89 month old house. Smoking: denies Occupation: Adult nurse HistorySurveyor, minerals in the house: no Carpet in the family room: yes Carpet in the bedroom: yes Heating: electric Cooling: central Pet: no  Family History: Family History  Problem Relation Age of Onset  . Cancer Maternal Aunt        bleeding disorder   .  Cancer Maternal Grandfather        throat cancer   . Allergic rhinitis Sister   . Asthma Sister   . Eczema Neg Hx   . Urticaria Neg Hx    Review of Systems  Constitutional: Negative for appetite change, chills, fever and unexpected weight change.  HENT: Negative for congestion and rhinorrhea.   Eyes: Negative for itching.  Respiratory: Negative for cough, chest tightness, shortness of breath and wheezing.   Cardiovascular: Negative for chest pain.  Gastrointestinal: Negative for abdominal pain.  Genitourinary: Negative for difficulty urinating.  Skin: Negative for rash.  Allergic/Immunologic: Positive for environmental allergies.  Neurological: Negative for headaches.   Objective: BP (!) 110/56 (BP Location: Left Arm, Patient Position: Sitting, Cuff Size: Large)   Pulse 82   Temp 98.4 F (36.9 C) (Oral)   Resp 18   Ht 5' 5.8" (1.671 m)   Wt 210 lb 6.4 oz (95.4 kg)   SpO2 98%   BMI 34.17 kg/m  Body mass index is 34.17 kg/m. Physical Exam  Constitutional: She is oriented to person, place, and time. She appears well-developed and well-nourished.  HENT:  Head: Normocephalic and atraumatic.  Right Ear: External ear normal.  Left Ear: External ear normal.  Nose: Nose normal.  Mouth/Throat: Oropharynx is clear  and moist.  Eyes: Conjunctivae and EOM are normal.  Neck: Neck supple.  Cardiovascular: Normal rate, regular rhythm and normal heart sounds. Exam reveals no gallop and no friction rub.  No murmur heard. Pulmonary/Chest: Effort normal and breath sounds normal. She has no wheezes. She has no rales.  Abdominal: Soft.  Lymphadenopathy:    She has no cervical adenopathy.  Neurological: She is alert and oriented to person, place, and time.  Skin: Skin is warm. No rash noted.  Psychiatric: She has a normal mood and affect. Her behavior is normal.  Nursing note and vitals reviewed.  The plan was reviewed with the patient/family, and all questions/concerned were addressed.  It was my pleasure to see Lalisa today and participate in her care. Please feel free to contact me with any questions or concerns.  Sincerely,  Wyline Mood, DO Allergy & Immunology  [facility] office: [phone] [facility] office: [phone]

## 2018-09-12 LAB — TRYPTASE: Tryptase: 2.5 ug/L (ref 2.2–13.2)

## 2018-09-12 LAB — ALLERGEN, APPLE F49: Allergen Apple, IgE: <0.1 kU/L

## 2018-09-12 LAB — C1 ESTERASE INHIBITOR: C1INH SerPl-mCnc: 26 mg/dL (ref 21–39)

## 2018-09-12 LAB — C4 COMPLEMENT: Complement C4, Serum: 17 mg/dL (ref 14–44)

## 2018-09-12 LAB — C1 ESTERASE INHIBITOR, FUNCTIONAL: C1INH Functional/C1INH Total MFr SerPl: 75 %mean normal

## 2018-09-14 ENCOUNTER — Encounter: Payer: Self-pay | Admitting: Allergy

## 2019-05-21 ENCOUNTER — Other Ambulatory Visit: Payer: Self-pay | Admitting: Family Medicine

## 2019-05-21 ENCOUNTER — Other Ambulatory Visit (HOSPITAL_COMMUNITY)
Admission: RE | Admit: 2019-05-21 | Discharge: 2019-05-21 | Disposition: A | Discharge status: Home / Self Care | Payer: BC Managed Care – PPO | Source: Ambulatory Visit | Attending: Family Medicine | Admitting: Family Medicine

## 2019-05-21 DIAGNOSIS — Z0000 Encounter for general adult medical examination without abnormal findings: Secondary | ICD-10-CM | POA: Diagnosis present

## 2019-05-27 LAB — CYTOLOGY - PAP
Diagnosis: NEGATIVE
High risk HPV: POSITIVE — AB
Molecular Disclaimer: 56
Molecular Disclaimer: NORMAL

## 2020-04-14 ENCOUNTER — Other Ambulatory Visit: Payer: BC Managed Care – PPO

## 2020-05-29 ENCOUNTER — Other Ambulatory Visit: Payer: Self-pay | Admitting: Family Medicine

## 2020-05-29 ENCOUNTER — Other Ambulatory Visit (HOSPITAL_COMMUNITY)
Admission: RE | Admit: 2020-05-29 | Discharge: 2020-05-29 | Disposition: A | Discharge status: Home / Self Care | Payer: BC Managed Care – PPO | Source: Ambulatory Visit | Attending: Family Medicine | Admitting: Family Medicine

## 2020-05-29 DIAGNOSIS — Z0000 Encounter for general adult medical examination without abnormal findings: Secondary | ICD-10-CM | POA: Insufficient documentation

## 2020-05-31 LAB — CYTOLOGY - PAP
Comment: NEGATIVE
Diagnosis: NEGATIVE
High risk HPV: POSITIVE — AB

## 2020-08-10 DIAGNOSIS — M545 Low back pain, unspecified: Secondary | ICD-10-CM | POA: Diagnosis not present

## 2020-08-26 DIAGNOSIS — Z20822 Contact with and (suspected) exposure to covid-19: Secondary | ICD-10-CM | POA: Diagnosis not present

## 2020-09-24 DIAGNOSIS — F411 Generalized anxiety disorder: Secondary | ICD-10-CM | POA: Diagnosis not present

## 2020-10-15 DIAGNOSIS — F411 Generalized anxiety disorder: Secondary | ICD-10-CM | POA: Diagnosis not present

## 2020-10-23 DIAGNOSIS — H02053 Trichiasis without entropian right eye, unspecified eyelid: Secondary | ICD-10-CM | POA: Diagnosis not present

## 2020-10-23 DIAGNOSIS — H02056 Trichiasis without entropian left eye, unspecified eyelid: Secondary | ICD-10-CM | POA: Diagnosis not present

## 2020-10-23 DIAGNOSIS — H189 Unspecified disorder of cornea: Secondary | ICD-10-CM | POA: Diagnosis not present

## 2020-11-09 DIAGNOSIS — H02056 Trichiasis without entropian left eye, unspecified eyelid: Secondary | ICD-10-CM | POA: Diagnosis not present

## 2020-11-09 DIAGNOSIS — H02053 Trichiasis without entropian right eye, unspecified eyelid: Secondary | ICD-10-CM | POA: Diagnosis not present

## 2020-11-12 DIAGNOSIS — F411 Generalized anxiety disorder: Secondary | ICD-10-CM | POA: Diagnosis not present

## 2020-12-17 DIAGNOSIS — F411 Generalized anxiety disorder: Secondary | ICD-10-CM | POA: Diagnosis not present

## 2021-01-11 DIAGNOSIS — H02055 Trichiasis without entropian left lower eyelid: Secondary | ICD-10-CM | POA: Diagnosis not present

## 2021-01-11 DIAGNOSIS — H02052 Trichiasis without entropian right lower eyelid: Secondary | ICD-10-CM | POA: Diagnosis not present

## 2021-01-14 DIAGNOSIS — F411 Generalized anxiety disorder: Secondary | ICD-10-CM | POA: Diagnosis not present

## 2021-03-11 DIAGNOSIS — F411 Generalized anxiety disorder: Secondary | ICD-10-CM | POA: Diagnosis not present

## 2021-03-18 DIAGNOSIS — F411 Generalized anxiety disorder: Secondary | ICD-10-CM | POA: Diagnosis not present

## 2021-03-25 DIAGNOSIS — F411 Generalized anxiety disorder: Secondary | ICD-10-CM | POA: Diagnosis not present

## 2021-05-06 DIAGNOSIS — F411 Generalized anxiety disorder: Secondary | ICD-10-CM | POA: Diagnosis not present

## 2021-06-03 DIAGNOSIS — F411 Generalized anxiety disorder: Secondary | ICD-10-CM | POA: Diagnosis not present

## 2021-06-13 DIAGNOSIS — Z0000 Encounter for general adult medical examination without abnormal findings: Secondary | ICD-10-CM | POA: Diagnosis not present

## 2021-06-13 DIAGNOSIS — Z13220 Encounter for screening for lipoid disorders: Secondary | ICD-10-CM | POA: Diagnosis not present

## 2021-07-01 DIAGNOSIS — F411 Generalized anxiety disorder: Secondary | ICD-10-CM | POA: Diagnosis not present

## 2021-07-29 DIAGNOSIS — F411 Generalized anxiety disorder: Secondary | ICD-10-CM | POA: Diagnosis not present

## 2021-09-16 DIAGNOSIS — F411 Generalized anxiety disorder: Secondary | ICD-10-CM | POA: Diagnosis not present

## 2021-10-14 DIAGNOSIS — F411 Generalized anxiety disorder: Secondary | ICD-10-CM | POA: Diagnosis not present

## 2021-11-11 DIAGNOSIS — F411 Generalized anxiety disorder: Secondary | ICD-10-CM | POA: Diagnosis not present

## 2021-12-09 DIAGNOSIS — F411 Generalized anxiety disorder: Secondary | ICD-10-CM | POA: Diagnosis not present

## 2022-03-10 DIAGNOSIS — F411 Generalized anxiety disorder: Secondary | ICD-10-CM | POA: Diagnosis not present

## 2022-03-29 ENCOUNTER — Other Ambulatory Visit: Payer: Self-pay | Admitting: Family Medicine

## 2022-03-29 ENCOUNTER — Ambulatory Visit
Admission: RE | Admit: 2022-03-29 | Discharge: 2022-03-29 | Disposition: A | Discharge status: Home / Self Care | Payer: BC Managed Care – PPO | Source: Ambulatory Visit | Attending: Family Medicine | Admitting: Family Medicine

## 2022-03-29 DIAGNOSIS — M25512 Pain in left shoulder: Secondary | ICD-10-CM | POA: Diagnosis not present

## 2022-03-29 DIAGNOSIS — S46912A Strain of unspecified muscle, fascia and tendon at shoulder and upper arm level, left arm, initial encounter: Secondary | ICD-10-CM | POA: Diagnosis not present

## 2022-03-29 DIAGNOSIS — R52 Pain, unspecified: Secondary | ICD-10-CM

## 2022-05-12 DIAGNOSIS — F411 Generalized anxiety disorder: Secondary | ICD-10-CM | POA: Diagnosis not present

## 2022-06-16 DIAGNOSIS — F411 Generalized anxiety disorder: Secondary | ICD-10-CM | POA: Diagnosis not present

## 2022-07-17 DIAGNOSIS — E7800 Pure hypercholesterolemia, unspecified: Secondary | ICD-10-CM | POA: Diagnosis not present

## 2022-07-17 DIAGNOSIS — Z0000 Encounter for general adult medical examination without abnormal findings: Secondary | ICD-10-CM | POA: Diagnosis not present

## 2022-07-17 DIAGNOSIS — Z23 Encounter for immunization: Secondary | ICD-10-CM | POA: Diagnosis not present

## 2022-07-17 DIAGNOSIS — N939 Abnormal uterine and vaginal bleeding, unspecified: Secondary | ICD-10-CM | POA: Diagnosis not present

## 2022-07-28 DIAGNOSIS — F411 Generalized anxiety disorder: Secondary | ICD-10-CM | POA: Diagnosis not present

## 2022-08-07 ENCOUNTER — Other Ambulatory Visit: Payer: Self-pay | Admitting: Obstetrics and Gynecology

## 2022-08-07 DIAGNOSIS — N870 Mild cervical dysplasia: Secondary | ICD-10-CM | POA: Diagnosis not present

## 2022-08-07 DIAGNOSIS — Z3202 Encounter for pregnancy test, result negative: Secondary | ICD-10-CM | POA: Diagnosis not present

## 2022-08-07 DIAGNOSIS — Z111 Encounter for screening for respiratory tuberculosis: Secondary | ICD-10-CM | POA: Diagnosis not present

## 2022-08-12 ENCOUNTER — Other Ambulatory Visit: Payer: Self-pay | Admitting: Family Medicine

## 2022-08-12 DIAGNOSIS — Z1231 Encounter for screening mammogram for malignant neoplasm of breast: Secondary | ICD-10-CM

## 2022-10-13 DIAGNOSIS — F4311 Post-traumatic stress disorder, acute: Secondary | ICD-10-CM | POA: Diagnosis not present

## 2022-11-07 DIAGNOSIS — N898 Other specified noninflammatory disorders of vagina: Secondary | ICD-10-CM | POA: Diagnosis not present

## 2022-11-07 DIAGNOSIS — R3989 Other symptoms and signs involving the genitourinary system: Secondary | ICD-10-CM | POA: Diagnosis not present

## 2022-11-07 DIAGNOSIS — N390 Urinary tract infection, site not specified: Secondary | ICD-10-CM | POA: Diagnosis not present

## 2022-12-30 ENCOUNTER — Ambulatory Visit
Admission: RE | Admit: 2022-12-30 | Discharge: 2022-12-30 | Disposition: A | Discharge status: Home / Self Care | Payer: BC Managed Care – PPO | Source: Ambulatory Visit | Attending: Family Medicine | Admitting: Family Medicine

## 2022-12-30 DIAGNOSIS — Z1231 Encounter for screening mammogram for malignant neoplasm of breast: Secondary | ICD-10-CM

## 2022-12-31 DIAGNOSIS — F411 Generalized anxiety disorder: Secondary | ICD-10-CM | POA: Diagnosis not present

## 2023-01-01 ENCOUNTER — Other Ambulatory Visit: Payer: Self-pay | Admitting: Family Medicine

## 2023-01-01 DIAGNOSIS — R928 Other abnormal and inconclusive findings on diagnostic imaging of breast: Secondary | ICD-10-CM

## 2023-01-15 ENCOUNTER — Other Ambulatory Visit: Payer: Self-pay | Admitting: Family Medicine

## 2023-01-15 ENCOUNTER — Ambulatory Visit
Admission: RE | Admit: 2023-01-15 | Discharge: 2023-01-15 | Disposition: A | Discharge status: Home / Self Care | Payer: BC Managed Care – PPO | Source: Ambulatory Visit | Attending: Family Medicine | Admitting: Family Medicine

## 2023-01-15 DIAGNOSIS — N63 Unspecified lump in unspecified breast: Secondary | ICD-10-CM | POA: Diagnosis not present

## 2023-01-15 DIAGNOSIS — N6320 Unspecified lump in the left breast, unspecified quadrant: Secondary | ICD-10-CM

## 2023-01-15 DIAGNOSIS — R928 Other abnormal and inconclusive findings on diagnostic imaging of breast: Secondary | ICD-10-CM

## 2023-01-15 DIAGNOSIS — R590 Localized enlarged lymph nodes: Secondary | ICD-10-CM | POA: Diagnosis not present

## 2023-01-15 DIAGNOSIS — R922 Inconclusive mammogram: Secondary | ICD-10-CM | POA: Diagnosis not present

## 2023-01-17 DIAGNOSIS — F411 Generalized anxiety disorder: Secondary | ICD-10-CM | POA: Diagnosis not present

## 2023-02-03 DIAGNOSIS — F411 Generalized anxiety disorder: Secondary | ICD-10-CM | POA: Diagnosis not present

## 2023-02-05 DIAGNOSIS — Z30432 Encounter for removal of intrauterine contraceptive device: Secondary | ICD-10-CM | POA: Diagnosis not present

## 2023-03-17 DIAGNOSIS — F411 Generalized anxiety disorder: Secondary | ICD-10-CM | POA: Diagnosis not present

## 2023-04-14 DIAGNOSIS — F411 Generalized anxiety disorder: Secondary | ICD-10-CM | POA: Diagnosis not present

## 2023-07-07 ENCOUNTER — Ambulatory Visit
Admission: RE | Admit: 2023-07-07 | Discharge: 2023-07-07 | Disposition: A | Discharge status: Home / Self Care | Payer: BC Managed Care – PPO | Source: Ambulatory Visit | Attending: Family Medicine | Admitting: Family Medicine

## 2023-07-07 ENCOUNTER — Other Ambulatory Visit: Payer: Self-pay | Admitting: Family Medicine

## 2023-07-07 DIAGNOSIS — N6320 Unspecified lump in the left breast, unspecified quadrant: Secondary | ICD-10-CM

## 2023-07-07 DIAGNOSIS — N6321 Unspecified lump in the left breast, upper outer quadrant: Secondary | ICD-10-CM | POA: Diagnosis not present

## 2023-07-07 DIAGNOSIS — R928 Other abnormal and inconclusive findings on diagnostic imaging of breast: Secondary | ICD-10-CM

## 2023-07-23 DIAGNOSIS — Z0000 Encounter for general adult medical examination without abnormal findings: Secondary | ICD-10-CM | POA: Diagnosis not present

## 2023-07-23 DIAGNOSIS — E7800 Pure hypercholesterolemia, unspecified: Secondary | ICD-10-CM | POA: Diagnosis not present

## 2023-08-25 ENCOUNTER — Other Ambulatory Visit (HOSPITAL_COMMUNITY)
Admission: RE | Admit: 2023-08-25 | Discharge: 2023-08-25 | Disposition: A | Discharge status: Home / Self Care | Payer: BC Managed Care – PPO | Source: Ambulatory Visit | Attending: Obstetrics and Gynecology | Admitting: Obstetrics and Gynecology

## 2023-08-25 DIAGNOSIS — Z01419 Encounter for gynecological examination (general) (routine) without abnormal findings: Secondary | ICD-10-CM | POA: Diagnosis not present

## 2023-08-25 DIAGNOSIS — R87610 Atypical squamous cells of undetermined significance on cytologic smear of cervix (ASC-US): Secondary | ICD-10-CM | POA: Insufficient documentation

## 2023-08-26 DIAGNOSIS — E669 Obesity, unspecified: Secondary | ICD-10-CM | POA: Diagnosis not present

## 2023-08-29 LAB — CYTOLOGY - PAP
Comment: NEGATIVE
Comment: NEGATIVE
Comment: NEGATIVE
Diagnosis: UNDETERMINED — AB
HPV 16: NEGATIVE
HPV 18 / 45: NEGATIVE
High risk HPV: POSITIVE — AB

## 2023-09-30 ENCOUNTER — Other Ambulatory Visit: Payer: Self-pay | Admitting: Obstetrics and Gynecology

## 2023-09-30 DIAGNOSIS — Z3202 Encounter for pregnancy test, result negative: Secondary | ICD-10-CM | POA: Diagnosis not present

## 2023-09-30 DIAGNOSIS — N889 Noninflammatory disorder of cervix uteri, unspecified: Secondary | ICD-10-CM | POA: Diagnosis not present

## 2023-09-30 DIAGNOSIS — N871 Moderate cervical dysplasia: Secondary | ICD-10-CM | POA: Diagnosis not present

## 2023-10-02 LAB — SURGICAL PATHOLOGY

## 2023-10-07 DIAGNOSIS — N871 Moderate cervical dysplasia: Secondary | ICD-10-CM | POA: Diagnosis not present

## 2023-10-27 ENCOUNTER — Other Ambulatory Visit: Payer: Self-pay | Admitting: Obstetrics and Gynecology

## 2023-10-27 DIAGNOSIS — N870 Mild cervical dysplasia: Secondary | ICD-10-CM | POA: Diagnosis not present

## 2023-10-27 DIAGNOSIS — N871 Moderate cervical dysplasia: Secondary | ICD-10-CM | POA: Diagnosis not present

## 2023-10-27 DIAGNOSIS — Z3202 Encounter for pregnancy test, result negative: Secondary | ICD-10-CM | POA: Diagnosis not present

## 2023-10-31 LAB — SURGICAL PATHOLOGY

## 2023-11-26 DIAGNOSIS — N871 Moderate cervical dysplasia: Secondary | ICD-10-CM | POA: Diagnosis not present

## 2024-01-05 ENCOUNTER — Ambulatory Visit
Admission: RE | Admit: 2024-01-05 | Discharge: 2024-01-05 | Disposition: A | Discharge status: Home / Self Care | Source: Ambulatory Visit | Attending: Family Medicine | Admitting: Family Medicine

## 2024-01-05 DIAGNOSIS — N6320 Unspecified lump in the left breast, unspecified quadrant: Secondary | ICD-10-CM

## 2024-01-05 DIAGNOSIS — R590 Localized enlarged lymph nodes: Secondary | ICD-10-CM | POA: Diagnosis not present

## 2024-02-24 DIAGNOSIS — E6609 Other obesity due to excess calories: Secondary | ICD-10-CM | POA: Diagnosis not present

## 2024-04-22 DIAGNOSIS — R399 Unspecified symptoms and signs involving the genitourinary system: Secondary | ICD-10-CM | POA: Diagnosis not present

## 2024-04-22 DIAGNOSIS — R350 Frequency of micturition: Secondary | ICD-10-CM | POA: Diagnosis not present

## 2024-05-27 ENCOUNTER — Other Ambulatory Visit (HOSPITAL_COMMUNITY)
Admission: RE | Admit: 2024-05-27 | Discharge: 2024-05-27 | Disposition: A | Discharge status: Home / Self Care | Source: Ambulatory Visit | Attending: Obstetrics and Gynecology | Admitting: Obstetrics and Gynecology

## 2024-05-27 DIAGNOSIS — Z01419 Encounter for gynecological examination (general) (routine) without abnormal findings: Secondary | ICD-10-CM | POA: Diagnosis not present

## 2024-05-27 DIAGNOSIS — N871 Moderate cervical dysplasia: Secondary | ICD-10-CM | POA: Diagnosis not present

## 2024-05-31 LAB — CYTOLOGY - PAP
Comment: NEGATIVE
Diagnosis: NEGATIVE
High risk HPV: NEGATIVE

## 2024-07-30 DIAGNOSIS — E669 Obesity, unspecified: Secondary | ICD-10-CM | POA: Diagnosis not present

## 2024-07-30 DIAGNOSIS — E7800 Pure hypercholesterolemia, unspecified: Secondary | ICD-10-CM | POA: Diagnosis not present

## 2024-07-30 DIAGNOSIS — Z0000 Encounter for general adult medical examination without abnormal findings: Secondary | ICD-10-CM | POA: Diagnosis not present

## 2024-08-27 ENCOUNTER — Other Ambulatory Visit: Payer: Self-pay | Admitting: Family Medicine

## 2024-08-27 ENCOUNTER — Encounter: Payer: Self-pay | Admitting: Family Medicine

## 2024-08-27 DIAGNOSIS — Z09 Encounter for follow-up examination after completed treatment for conditions other than malignant neoplasm: Secondary | ICD-10-CM

## 2024-08-27 DIAGNOSIS — N6321 Unspecified lump in the left breast, upper outer quadrant: Secondary | ICD-10-CM

## 2025-01-07 ENCOUNTER — Encounter

## 2025-01-07 ENCOUNTER — Other Ambulatory Visit
